# Patient Record
Sex: Male | Born: 1945 | Hispanic: No | Marital: Married | Smoking: Former smoker
Health system: Southern US, Community
[De-identification: ages and names within clinical notes are randomized; demographics above are authoritative.]

## PROBLEM LIST (undated history)

## (undated) HISTORY — PX: KNEE ARTHROSCOPY: SHX127

## (undated) HISTORY — PX: MANDIBLE RECONSTRUCTION: SHX431

---

## 2004-01-13 ENCOUNTER — Ambulatory Visit: Payer: Self-pay | Admitting: Internal Medicine

## 2004-01-21 ENCOUNTER — Ambulatory Visit: Payer: Self-pay | Admitting: Internal Medicine

## 2013-02-05 ENCOUNTER — Emergency Department (HOSPITAL_COMMUNITY): Payer: No Typology Code available for payment source

## 2013-02-05 ENCOUNTER — Emergency Department (HOSPITAL_COMMUNITY)
Admission: EM | Admit: 2013-02-05 | Discharge: 2013-02-05 | Disposition: A | Discharge status: Home / Self Care | Payer: No Typology Code available for payment source | Attending: Emergency Medicine | Admitting: Emergency Medicine

## 2013-02-05 ENCOUNTER — Encounter (HOSPITAL_COMMUNITY): Payer: Self-pay | Admitting: Emergency Medicine

## 2013-02-05 DIAGNOSIS — S8011XA Contusion of right lower leg, initial encounter: Secondary | ICD-10-CM

## 2013-02-05 DIAGNOSIS — Y92410 Unspecified street and highway as the place of occurrence of the external cause: Secondary | ICD-10-CM | POA: Insufficient documentation

## 2013-02-05 DIAGNOSIS — S8010XA Contusion of unspecified lower leg, initial encounter: Secondary | ICD-10-CM | POA: Diagnosis not present

## 2013-02-05 DIAGNOSIS — S199XXA Unspecified injury of neck, initial encounter: Secondary | ICD-10-CM

## 2013-02-05 DIAGNOSIS — S060X9A Concussion with loss of consciousness of unspecified duration, initial encounter: Secondary | ICD-10-CM | POA: Diagnosis not present

## 2013-02-05 DIAGNOSIS — S0993XA Unspecified injury of face, initial encounter: Secondary | ICD-10-CM | POA: Diagnosis not present

## 2013-02-05 DIAGNOSIS — S20219A Contusion of unspecified front wall of thorax, initial encounter: Secondary | ICD-10-CM | POA: Diagnosis not present

## 2013-02-05 DIAGNOSIS — Y9389 Activity, other specified: Secondary | ICD-10-CM | POA: Diagnosis not present

## 2013-02-05 DIAGNOSIS — IMO0002 Reserved for concepts with insufficient information to code with codable children: Secondary | ICD-10-CM | POA: Diagnosis not present

## 2013-02-05 DIAGNOSIS — S20211A Contusion of right front wall of thorax, initial encounter: Secondary | ICD-10-CM

## 2013-02-05 DIAGNOSIS — S60512A Abrasion of left hand, initial encounter: Secondary | ICD-10-CM

## 2013-02-05 DIAGNOSIS — S298XXA Other specified injuries of thorax, initial encounter: Secondary | ICD-10-CM | POA: Diagnosis present

## 2013-02-05 DIAGNOSIS — Z96659 Presence of unspecified artificial knee joint: Secondary | ICD-10-CM | POA: Insufficient documentation

## 2013-02-05 MED ORDER — HYDROCODONE-ACETAMINOPHEN 5-325 MG PO TABS: 1.0000 | ORAL_TABLET | Freq: Four times a day (QID) | ORAL | Status: AC | PRN

## 2013-02-05 MED ORDER — ONDANSETRON HCL 4 MG/2ML IJ SOLN
4.0000 mg | Freq: Once | INTRAMUSCULAR | Status: AC
  Administered 2013-02-05: 4 mg via INTRAVENOUS
  Filled 2013-02-05: qty 2

## 2013-02-05 MED ORDER — HYDROMORPHONE HCL PF 1 MG/ML IJ SOLN
1.0000 mg | Freq: Once | INTRAMUSCULAR | Status: AC
  Administered 2013-02-05: 1 mg via INTRAVENOUS
  Filled 2013-02-05: qty 1
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
## 2013-02-05 NOTE — ED Notes (Signed)
X-ray at bedside

## 2013-08-08 ENCOUNTER — Encounter: Payer: Self-pay | Admitting: Internal Medicine
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
